# Patient Record
Sex: Female | Born: 2000 | Race: White | Hispanic: No | Marital: Single
Health system: Southern US, Community
[De-identification: ages and names within clinical notes are randomized; demographics above are authoritative.]

## PROBLEM LIST (undated history)

## (undated) DIAGNOSIS — K219 Gastro-esophageal reflux disease without esophagitis: Secondary | ICD-10-CM

## (undated) DIAGNOSIS — E669 Obesity, unspecified: Secondary | ICD-10-CM

## (undated) DIAGNOSIS — M5030 Other cervical disc degeneration, unspecified cervical region: Secondary | ICD-10-CM

---

## 2020-11-05 ENCOUNTER — Other Ambulatory Visit: Payer: Self-pay

## 2020-11-05 ENCOUNTER — Emergency Department
Admission: EM | Admit: 2020-11-05 | Discharge: 2020-11-05 | Disposition: A | Discharge status: Home / Self Care | Payer: BLUE CROSS/BLUE SHIELD | Attending: Emergency Medicine | Admitting: Emergency Medicine

## 2020-11-05 DIAGNOSIS — Y92410 Unspecified street and highway as the place of occurrence of the external cause: Secondary | ICD-10-CM | POA: Diagnosis not present

## 2020-11-05 DIAGNOSIS — Z91040 Latex allergy status: Secondary | ICD-10-CM | POA: Insufficient documentation

## 2020-11-05 DIAGNOSIS — G4486 Cervicogenic headache: Secondary | ICD-10-CM | POA: Insufficient documentation

## 2020-11-05 DIAGNOSIS — M542 Cervicalgia: Secondary | ICD-10-CM | POA: Diagnosis present

## 2020-11-05 MED ORDER — METHOCARBAMOL 750 MG PO TABS: 750.0000 mg | ORAL_TABLET | Freq: Four times a day (QID) | ORAL | 0 refills | Status: AC | PRN

## 2020-11-05 MED ORDER — MELOXICAM 15 MG PO TABS: 15.0000 mg | ORAL_TABLET | Freq: Every day | ORAL | 0 refills | Status: AC
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
## 2020-11-10 ENCOUNTER — Emergency Department: Payer: BLUE CROSS/BLUE SHIELD

## 2020-11-10 ENCOUNTER — Other Ambulatory Visit: Payer: Self-pay

## 2020-11-10 ENCOUNTER — Emergency Department
Admission: EM | Admit: 2020-11-10 | Discharge: 2020-11-10 | Disposition: A | Discharge status: Home / Self Care | Payer: BLUE CROSS/BLUE SHIELD | Attending: Emergency Medicine | Admitting: Emergency Medicine

## 2020-11-10 DIAGNOSIS — S39012D Strain of muscle, fascia and tendon of lower back, subsequent encounter: Secondary | ICD-10-CM | POA: Diagnosis not present

## 2020-11-10 DIAGNOSIS — S199XXD Unspecified injury of neck, subsequent encounter: Secondary | ICD-10-CM | POA: Diagnosis present

## 2020-11-10 DIAGNOSIS — S161XXD Strain of muscle, fascia and tendon at neck level, subsequent encounter: Secondary | ICD-10-CM | POA: Diagnosis not present

## 2020-11-10 DIAGNOSIS — R519 Headache, unspecified: Secondary | ICD-10-CM | POA: Diagnosis not present

## 2020-11-10 DIAGNOSIS — Z91040 Latex allergy status: Secondary | ICD-10-CM | POA: Insufficient documentation

## 2020-11-10 DIAGNOSIS — Y92410 Unspecified street and highway as the place of occurrence of the external cause: Secondary | ICD-10-CM | POA: Insufficient documentation

## 2020-11-10 LAB — POC URINE PREG, ED: Preg Test, Ur: NEGATIVE

## 2020-11-10 MED ORDER — METAXALONE 800 MG PO TABS: 800.0000 mg | ORAL_TABLET | Freq: Three times a day (TID) | ORAL | 0 refills | Status: AC
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
## 2020-11-10 NOTE — ED Notes (Signed)
Pt to xray and ct at this time
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
## 2020-12-19 ENCOUNTER — Other Ambulatory Visit: Payer: Self-pay

## 2020-12-19 ENCOUNTER — Emergency Department
Admission: EM | Admit: 2020-12-19 | Discharge: 2020-12-20 | Disposition: A | Discharge status: Home / Self Care | Payer: BLUE CROSS/BLUE SHIELD | Attending: Emergency Medicine | Admitting: Emergency Medicine

## 2020-12-19 ENCOUNTER — Emergency Department: Payer: BLUE CROSS/BLUE SHIELD

## 2020-12-19 DIAGNOSIS — R112 Nausea with vomiting, unspecified: Secondary | ICD-10-CM | POA: Insufficient documentation

## 2020-12-19 DIAGNOSIS — R0789 Other chest pain: Secondary | ICD-10-CM | POA: Insufficient documentation

## 2020-12-19 DIAGNOSIS — R1011 Right upper quadrant pain: Secondary | ICD-10-CM | POA: Diagnosis not present

## 2020-12-19 DIAGNOSIS — Z91040 Latex allergy status: Secondary | ICD-10-CM | POA: Diagnosis not present

## 2020-12-19 DIAGNOSIS — R1013 Epigastric pain: Secondary | ICD-10-CM | POA: Insufficient documentation

## 2020-12-19 DIAGNOSIS — R079 Chest pain, unspecified: Secondary | ICD-10-CM | POA: Diagnosis present

## 2020-12-19 HISTORY — DX: Gastro-esophageal reflux disease without esophagitis: K21.9

## 2020-12-19 HISTORY — DX: Obesity, unspecified: E66.9

## 2020-12-19 HISTORY — DX: Other cervical disc degeneration, unspecified cervical region: M50.30

## 2020-12-19 LAB — CBC
HCT: 37.9 % (ref 36.0–46.0)
Hemoglobin: 12.4 g/dL (ref 12.0–15.0)
MCH: 26.4 pg (ref 26.0–34.0)
MCHC: 32.7 g/dL (ref 30.0–36.0)
MCV: 80.8 fL (ref 80.0–100.0)
Platelets: 302 10*3/uL (ref 150–400)
RBC: 4.69 MIL/uL (ref 3.87–5.11)
RDW: 12.3 % (ref 11.5–15.5)
WBC: 7.9 10*3/uL (ref 4.0–10.5)
nRBC: 0 % (ref 0.0–0.2)

## 2020-12-19 LAB — BASIC METABOLIC PANEL
Anion gap: 8 (ref 5–15)
BUN: 23 mg/dL — ABNORMAL HIGH (ref 6–20)
CO2: 25 mmol/L (ref 22–32)
Calcium: 9 mg/dL (ref 8.9–10.3)
Chloride: 104 mmol/L (ref 98–111)
Creatinine, Ser: 0.83 mg/dL (ref 0.44–1.00)
GFR, Estimated: >60 mL/min (ref >60)
Glucose, Bld: 98 mg/dL (ref 70–99)
Potassium: 3.6 mmol/L (ref 3.5–5.1)
Sodium: 137 mmol/L (ref 135–145)

## 2020-12-19 LAB — TROPONIN I (HIGH SENSITIVITY)
Troponin I (High Sensitivity): 6 ng/L (ref <18)
Troponin I (High Sensitivity): 6 ng/L (ref <18)
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
## 2020-12-20 ENCOUNTER — Encounter: Payer: Self-pay | Admitting: Emergency Medicine

## 2020-12-20 ENCOUNTER — Emergency Department: Payer: BLUE CROSS/BLUE SHIELD

## 2020-12-20 LAB — HEPATIC FUNCTION PANEL
ALT: 17 U/L (ref 0–44)
AST: 20 U/L (ref 15–41)
Albumin: 3.6 g/dL (ref 3.5–5.0)
Alkaline Phosphatase: 64 U/L (ref 38–126)
Bilirubin, Direct: <0.1 mg/dL (ref 0.0–0.2)
Total Bilirubin: 0.4 mg/dL (ref 0.3–1.2)
Total Protein: 7 g/dL (ref 6.5–8.1)

## 2020-12-20 LAB — LIPASE, BLOOD: Lipase: 27 U/L (ref 11–51)

## 2020-12-20 MED ORDER — PANTOPRAZOLE SODIUM 20 MG PO TBEC
20.0000 mg | DELAYED_RELEASE_TABLET | Freq: Once | ORAL | Status: AC
  Administered 2020-12-20: 20 mg via ORAL
  Filled 2020-12-20: qty 1

## 2020-12-20 MED ORDER — ONDANSETRON 4 MG PO TBDP: ORAL_TABLET | ORAL | 0 refills | Status: AC

## 2020-12-20 MED ORDER — ONDANSETRON 4 MG PO TBDP
4.0000 mg | ORAL_TABLET | Freq: Once | ORAL | Status: AC
  Administered 2020-12-20: 4 mg via ORAL
  Filled 2020-12-20: qty 1

## 2020-12-20 MED ORDER — SUCRALFATE 1 G PO TABS: 1.0000 g | ORAL_TABLET | Freq: Four times a day (QID) | ORAL | 1 refills | Status: AC | PRN

## 2020-12-20 MED ORDER — SUCRALFATE 1 G PO TABS
1.0000 g | ORAL_TABLET | Freq: Once | ORAL | Status: AC
  Administered 2020-12-20: 1 g via ORAL
  Filled 2020-12-20: qty 1

## 2020-12-20 MED ORDER — OMEPRAZOLE MAGNESIUM 20 MG PO TBEC: 20.0000 mg | DELAYED_RELEASE_TABLET | Freq: Every day | ORAL | 1 refills | Status: AC

## 2020-12-20 NOTE — ED Provider Notes (Signed)
[facility] Emergency Department Provider Note  ____________________________________________   Event Date/Time   First MD Initiated Contact with Patient 12/19/20 2350     (approximate)  I have reviewed the triage vital signs and the nursing notes.   HISTORY  Chief Complaint Chest Pain    HPI Rachel Williamson is a 20 y.o. female with no contributory past medical history who presents for evaluation of chest pain versus upper abdominal pain.   She states that 2 days ago she had an episode of vomiting and there was some blood in it.  She has not had any vomiting since then but has been nauseated and eating seems to make it worse.  She is also having some burning pain in her upper abdomen or chest.  She says she has a history of acid reflux but this feels worse.  She has no pain in her lower abdomen but has some pain around her right side up underneath the ribs.  She has had no recent trauma.  She has had a few loose stools as well but not persistent diarrhea.  No lower abdominal pain.  She says she has felt lightheaded and dizzy recently.  She denies headache, shortness of breath, leg swelling, leg pain, and fever/chills.  She describes the symptoms as intermittently mild to severe.        Past Medical History:  Diagnosis Date  . Acid reflux   . DDD (degenerative disc disease), cervical   . Obesity     There are no problems to display for this patient.   History reviewed. No pertinent surgical history.  Prior to Admission medications   Medication Sig Start Date End Date Taking? Authorizing Provider  omeprazole (PRILOSEC OTC) 20 MG tablet Take 1 tablet (20 mg total) by mouth daily. 12/20/20 12/20/21 Yes Loleta Rose, MD  ondansetron (ZOFRAN ODT) 4 MG disintegrating tablet Allow 1-2 tablets to dissolve in your mouth every 8 hours as needed for nausea/vomiting 12/20/20  Yes Loleta Rose, MD  sucralfate (CARAFATE) 1 g tablet Take 1 tablet (1 g total) by mouth  4 (four) times daily as needed (for abdominal discomfort, nausea, and/or vomiting). 12/20/20  Yes Loleta Rose, MD  metaxalone (SKELAXIN) 800 MG tablet Take 1 tablet (800 mg total) by mouth 3 (three) times daily. 11/10/20 11/10/21  Cuthriell, Delorise Royals, PA-C    Allergies Coconut (cocos nucifera) allergy skin test and Latex  History reviewed. No pertinent family history.  Social History    Review of Systems Constitutional: No fever/chills Eyes: No visual changes. ENT: No sore throat. Cardiovascular: Chest pain versus abdominal pain. Respiratory: Denies shortness of breath. Gastrointestinal: Upper abdominal pain, nausea, vomiting about 2 days ago with some hematemesis. Genitourinary: Negative for dysuria. Musculoskeletal: Negative for neck pain.  Negative for back pain. Integumentary: Negative for rash. Neurological: Negative for headaches, focal weakness or numbness.   ____________________________________________   PHYSICAL EXAM:  VITAL SIGNS: ED Triage Vitals  Enc Vitals Group     BP 12/19/20 2046 129/83     Pulse Rate 12/19/20 2046 97     Resp 12/19/20 2046 18     Temp 12/19/20 2046 97.9 F (36.6 C)     Temp Source 12/19/20 2046 Oral     SpO2 12/19/20 2046 97 %     Weight 12/19/20 2042 106.6 kg (235 lb)     Height 12/19/20 2042 1.6 m (5\' 3" )     Head Circumference --      Peak Flow --  Pain Score 12/19/20 2042 8     Pain Loc --      Pain Edu? --      Excl. in GC? --     Constitutional: Alert and oriented.  Eyes: Conjunctivae are normal.  Head: Atraumatic. Nose: No congestion/rhinnorhea. Mouth/Throat: Patient is wearing a mask. Neck: No stridor.  No meningeal signs.   Cardiovascular: Normal rate, regular rhythm. Good peripheral circulation. Respiratory: Normal respiratory effort.  No retractions. Gastrointestinal: Obese.  Soft.  Tender to palpation of the epigastrium and right upper quadrant with questionable Murphy sign.  No tenderness of the lower abdomen  including McBurney's point. Musculoskeletal: No lower extremity tenderness nor edema. No gross deformities of extremities. Neurologic:  Normal speech and language. No gross focal neurologic deficits are appreciated.  Skin:  Skin is warm, dry and intact. Psychiatric: Mood and affect are normal. Speech and behavior are normal.  ____________________________________________   LABS (all labs ordered are listed, but only abnormal results are displayed)  Labs Reviewed  BASIC METABOLIC PANEL - Abnormal; Notable for the following components:      Result Value   BUN 23 (*)    All other components within normal limits  CBC  HEPATIC FUNCTION PANEL  LIPASE, BLOOD  POC URINE PREG, ED  TROPONIN I (HIGH SENSITIVITY)  TROPONIN I (HIGH SENSITIVITY)   ____________________________________________  EKG  ED ECG REPORT I, Loleta Rose, the attending physician, personally viewed and interpreted this ECG.  Date: 12/19/2020 EKG Time: 20: 44 Rate: 94 Rhythm: normal sinus rhythm QRS Axis: normal Intervals: normal ST/T Wave abnormalities: normal Narrative Interpretation: no evidence of acute ischemia  ____________________________________________  RADIOLOGY I, Loleta Rose, personally viewed and evaluated these images (plain radiographs) as part of my medical decision making, as well as reviewing the written report by the radiologist.  ED MD interpretation: No acute abnormality on chest x-ray  Official radiology report(s): DG Chest 2 View  Result Date: 12/19/2020 CLINICAL DATA:  Shortness of breath. EXAM: CHEST - 2 VIEW COMPARISON:  None. FINDINGS: The heart size and mediastinal contours are within normal limits. Both lungs are clear. The visualized skeletal structures are unremarkable. IMPRESSION: No active cardiopulmonary disease. Electronically Signed   By: Gerome Sam III M.D   On: 12/19/2020 21:27   US ABDOMEN LIMITED RUQ (LIVER/GB)  Result Date: 12/20/2020 CLINICAL DATA:  Right upper  quadrant pain for 2 days EXAM: ULTRASOUND ABDOMEN LIMITED RIGHT UPPER QUADRANT COMPARISON:  None. FINDINGS: Gallbladder: No gallstones or wall thickening visualized. No sonographic Murphy sign noted by sonographer. Common bile duct: Diameter: 3.7 mm, nondilated Liver: Diffusely increased hepatic echogenicity with loss of definition of the portal triads and diminished posterior through transmission compatible with hepatic steatosis. Diminished posterior through transmission limits assessment of the posterior hepatic parenchyma. No visible lesions within the visible segments. No visible intrahepatic ductal dilatation. Portal vein is patent on color Doppler imaging with normal direction of blood flow towards the liver. Other: None. IMPRESSION: 1. Diffusely increased hepatic echogenicity, most commonly seen with hepatic steatosis. Diminished through transmission may limit detection of subtle masses by sonography. No other gross hepatic abnormality is seen. 2. Otherwise unremarkable right upper quadrant ultrasound. Electronically Signed   By: Kreg Shropshire M.D.   On: 12/20/2020 01:32    ____________________________________________   PROCEDURES   Procedure(s) performed (including Critical Care):  Procedures   ____________________________________________   INITIAL IMPRESSION / MDM / ASSESSMENT AND PLAN / ED COURSE  As part of my medical decision making, I reviewed the following  data within the electronic MEDICAL RECORD NUMBER Nursing notes reviewed and incorporated, Labs reviewed , EKG interpreted , Old chart reviewed, Radiograph reviewed  and Notes from prior ED visits   Differential diagnosis includes, but is not limited to, acid reflux, gastritis, biliary colic, ACS, PE, pneumonia, musculoskeletal injury.  The patient's vital signs are stable and reassuring.  Patient is PERC negative.  Very low risk of ACS based on HEAR score.  CBC and basic metabolic panel are both reassuring and 2 high-sensitivity  troponins are negative.  Based on the patient's demographics and the description of her symptoms I suspect biliary colic may be playing a role although she could simply be having acid reflux with some gastritis.  I explained that the hematemesis is likely an episode of Mallory-Weiss tear but it is good that she is not continued to have vomiting or hematemesis.  Her abdominal exam is generally reassuring except for some epigastric and right upper quadrant tenderness.  The plan is to proceed with a right upper quadrant ultrasound and add on lipase and hepatic function test.  Anticipate discharge with outpatient follow-up and the patient says she understands and agrees.      Clinical Course as of 12/20/20 0216  Sat Dec 20, 2020  0124 Normal lipase and hepatic function tests.  Ultrasound pending. [CF]  0208 Ultrasound unremarkable, some evidence of Hepatic steatosis but no acute abnormalities and no gallstones.    I updated the patient about the results and explained that we are going to treat with medications as listed below for acid reflux and encouraged her to follow-up with a primary care provider.  There is no evidence of an emergent medical condition at this time.  I gave my usual and customary return precautions.  [CF]    Clinical Course User Index [CF] Loleta RoseForbach, Kyros Salzwedel, MD     ____________________________________________  FINAL CLINICAL IMPRESSION(S) / ED DIAGNOSES  Final diagnoses:  Atypical chest pain  Non-intractable vomiting with nausea, unspecified vomiting type  Epigastric pain     MEDICATIONS GIVEN DURING THIS VISIT:  Medications  ondansetron (ZOFRAN-ODT) disintegrating tablet 4 mg (has no administration in time range)  sucralfate (CARAFATE) tablet 1 g (has no administration in time range)  pantoprazole (PROTONIX) EC tablet 20 mg (has no administration in time range)     ED Discharge Orders         Ordered    omeprazole (PRILOSEC OTC) 20 MG tablet  Daily         12/20/20 0212    sucralfate (CARAFATE) 1 g tablet  4 times daily PRN        12/20/20 0212    ondansetron (ZOFRAN ODT) 4 MG disintegrating tablet        12/20/20 [id number]          *Please note:  Rachel BoatmanKasey Cowdery was evaluated in Emergency Department on 12/20/2020 for the symptoms described in the history of present illness. She was evaluated in the context of the global COVID-19 pandemic, which necessitated consideration that the patient might be at risk for infection with the SARS-CoV-2 virus that causes COVID-19. Institutional protocols and algorithms that pertain to the evaluation of patients at risk for COVID-19 are in a state of rapid change based on information released by regulatory bodies including the CDC and federal and state organizations. These policies and algorithms were followed during the patient's care in the ED.  Some ED evaluations and interventions may be delayed as a result of limited staffing during and after  the pandemic.*  Note:  This document was prepared using Dragon voice recognition software and may include unintentional dictation errors.   Loleta Rose, MD 12/20/20 [phone]

## 2020-12-20 NOTE — Discharge Instructions (Addendum)
Your workup in the Emergency Department today was reassuring.  We did not find any specific abnormalities.  We recommend you drink plenty of fluids, take your regular medications and/or any new ones prescribed today, and follow up with the doctor(s) listed in these documents as recommended.  Return to the Emergency Department if you develop new or worsening symptoms that concern you.  

## 2021-02-27 ENCOUNTER — Ambulatory Visit: Payer: BLUE CROSS/BLUE SHIELD

## 2021-03-04 ENCOUNTER — Ambulatory Visit: Payer: BLUE CROSS/BLUE SHIELD
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
## 2022-03-23 IMAGING — CR DG CHEST 2V
2 series · 2 of 2 positions shown · non-contrast
Comparison: None.

CLINICAL DATA: Shortness of breath.

EXAM:
CHEST - 2 VIEW

[chest pa]
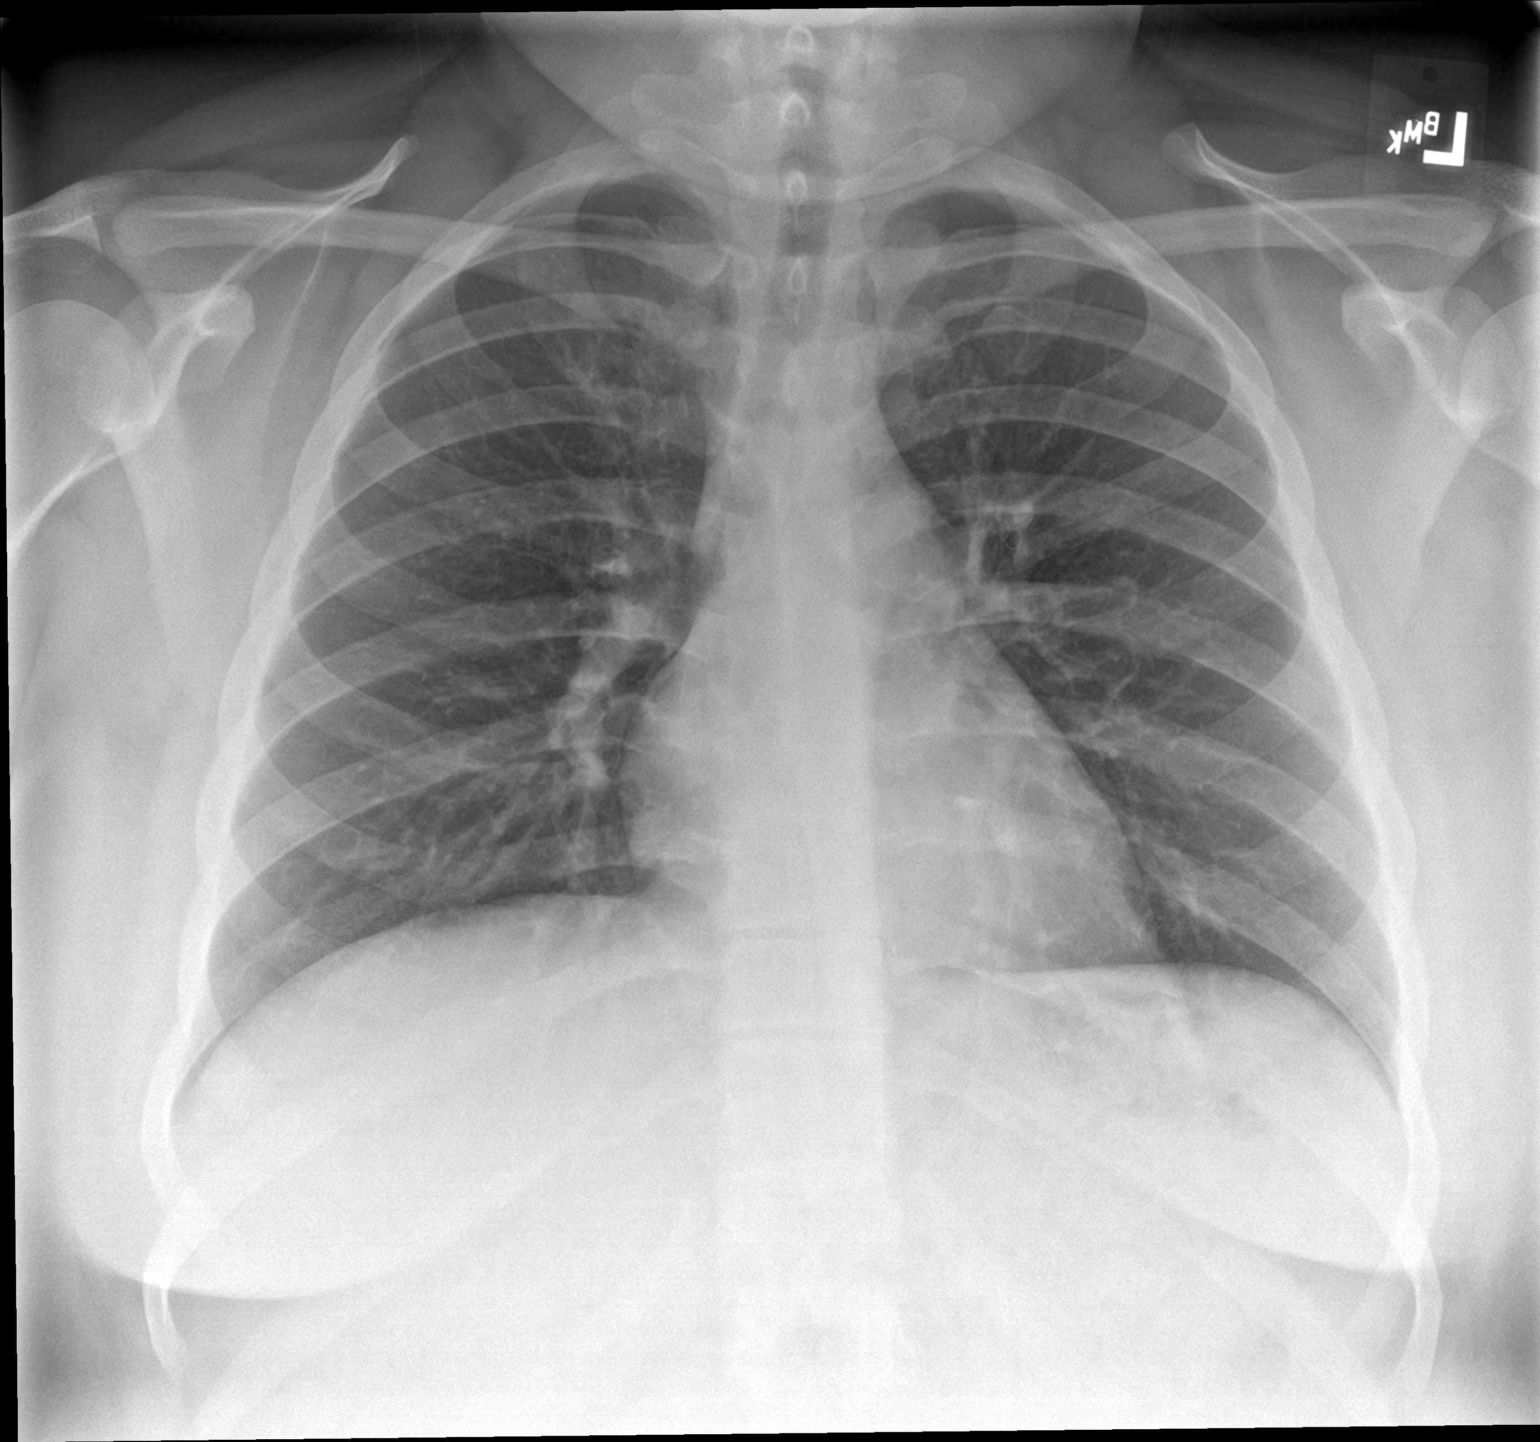

[chest lat]
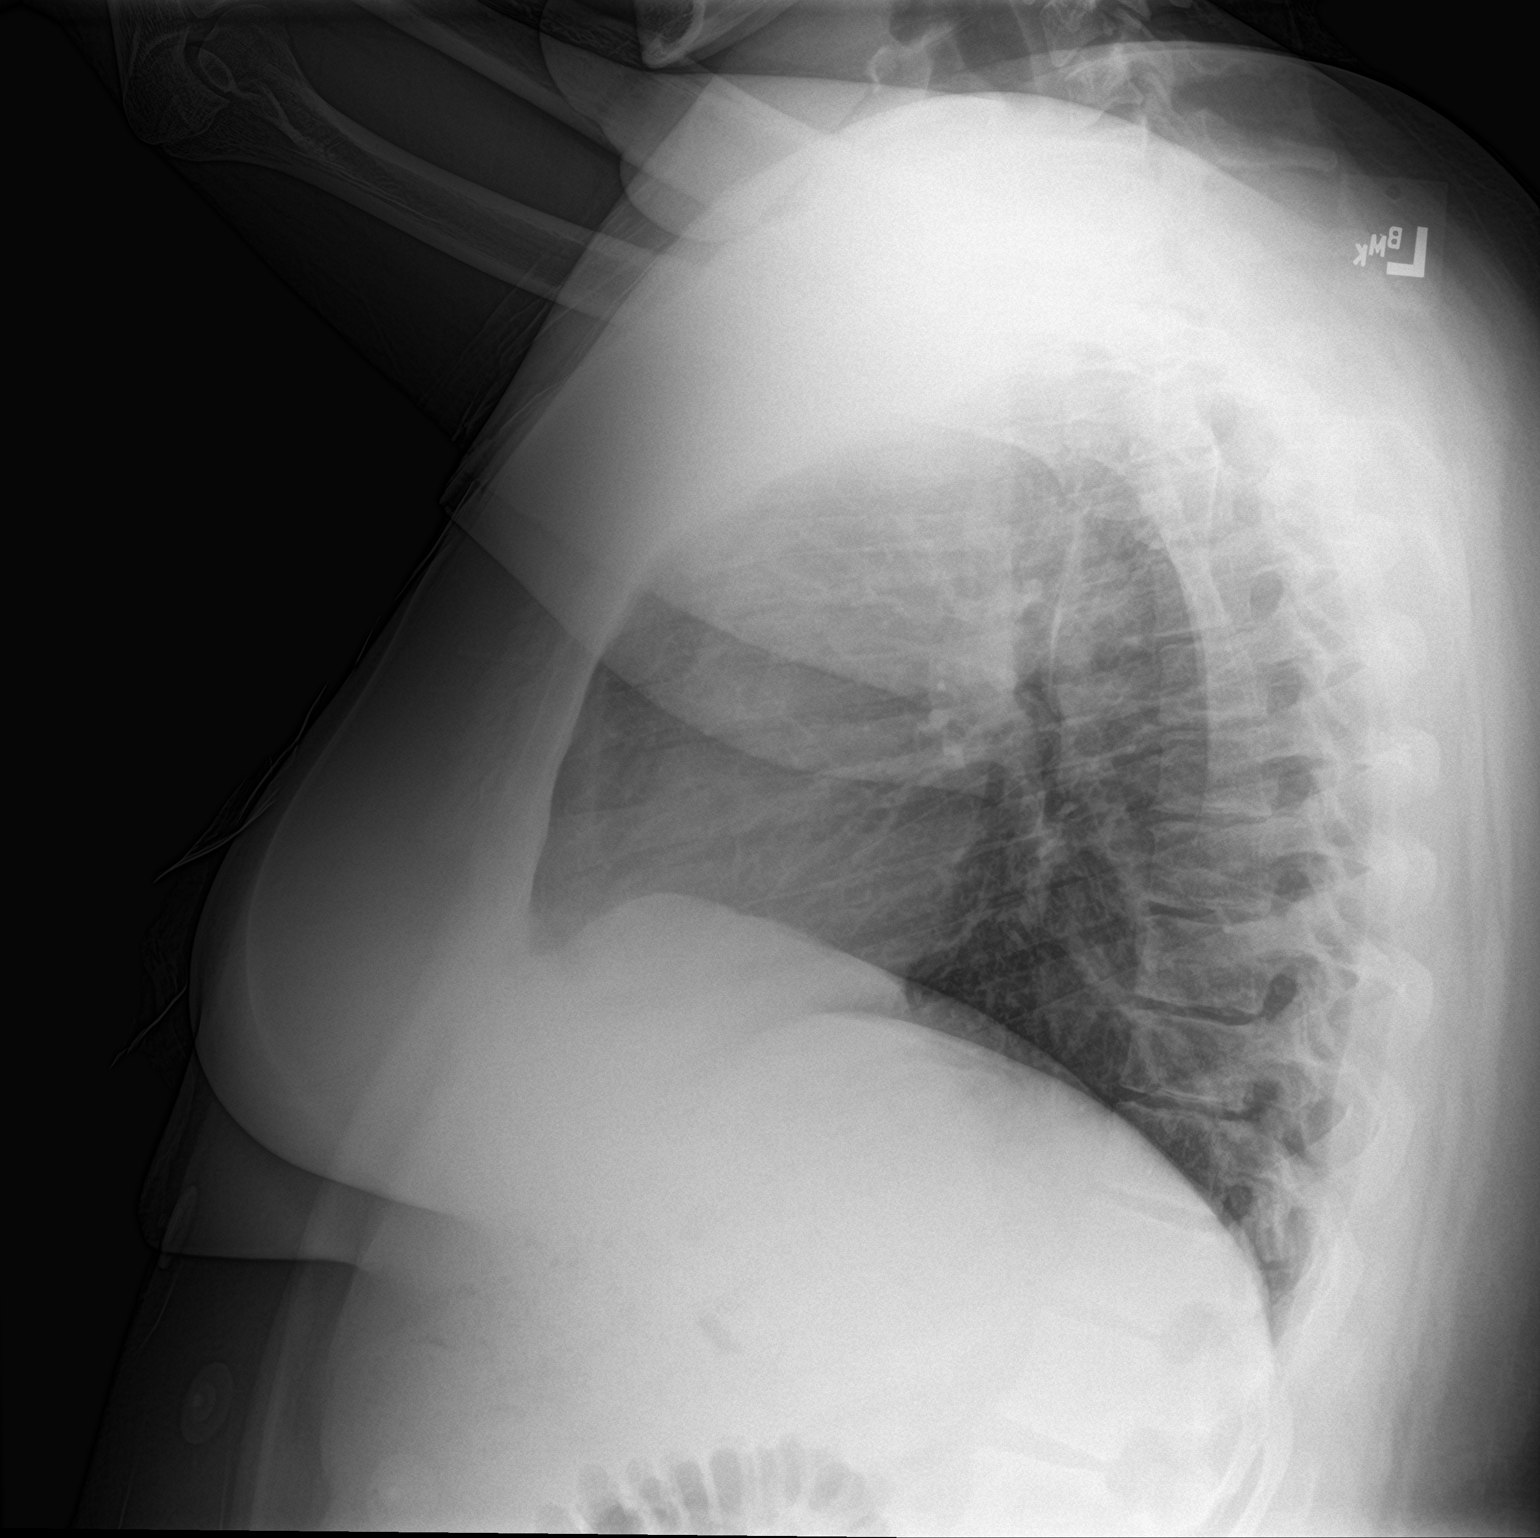

[2 of 2 positions shown; findings below may reference images not displayed]

FINDINGS: The heart size and mediastinal contours are within normal limits.
Both lungs are clear. The visualized skeletal structures are
unremarkable.
IMPRESSION: No active cardiopulmonary disease.
